# Patient Record
Sex: Female | Born: 1960 | Race: White | Hispanic: No | Marital: Single
Health system: Southern US, Community
[De-identification: ages and names within clinical notes are randomized; demographics above are authoritative.]

## PROBLEM LIST (undated history)

## (undated) DIAGNOSIS — E785 Hyperlipidemia, unspecified: Secondary | ICD-10-CM

## (undated) DIAGNOSIS — Q631 Lobulated, fused and horseshoe kidney: Secondary | ICD-10-CM

## (undated) DIAGNOSIS — I10 Essential (primary) hypertension: Secondary | ICD-10-CM

## (undated) DIAGNOSIS — E119 Type 2 diabetes mellitus without complications: Secondary | ICD-10-CM

## (undated) DIAGNOSIS — E781 Pure hyperglyceridemia: Secondary | ICD-10-CM

## (undated) HISTORY — PX: TOTAL KNEE ARTHROPLASTY: SHX125

---

## 2004-09-19 ENCOUNTER — Ambulatory Visit: Payer: Self-pay | Admitting: Obstetrics and Gynecology

## 2005-09-26 ENCOUNTER — Ambulatory Visit: Payer: Self-pay | Admitting: Obstetrics and Gynecology

## 2006-09-30 ENCOUNTER — Ambulatory Visit: Payer: Self-pay | Admitting: Obstetrics and Gynecology

## 2007-10-02 ENCOUNTER — Ambulatory Visit: Payer: Self-pay | Admitting: Obstetrics and Gynecology

## 2008-10-04 ENCOUNTER — Ambulatory Visit: Payer: Self-pay | Admitting: Obstetrics and Gynecology

## 2009-10-05 ENCOUNTER — Ambulatory Visit: Payer: Self-pay | Admitting: Obstetrics and Gynecology

## 2010-10-09 ENCOUNTER — Ambulatory Visit: Payer: Self-pay | Admitting: Obstetrics and Gynecology

## 2011-10-10 ENCOUNTER — Ambulatory Visit: Payer: Self-pay | Admitting: Obstetrics and Gynecology

## 2012-07-17 ENCOUNTER — Ambulatory Visit: Payer: Self-pay | Admitting: Neurology

## 2012-10-14 ENCOUNTER — Ambulatory Visit: Payer: Self-pay | Admitting: Obstetrics and Gynecology

## 2013-10-15 ENCOUNTER — Ambulatory Visit: Payer: Self-pay | Admitting: Obstetrics and Gynecology

## 2013-10-23 ENCOUNTER — Ambulatory Visit: Payer: Self-pay | Admitting: Obstetrics and Gynecology

## 2014-11-02 ENCOUNTER — Other Ambulatory Visit: Payer: Self-pay | Admitting: Obstetrics and Gynecology

## 2014-11-02 DIAGNOSIS — Z1231 Encounter for screening mammogram for malignant neoplasm of breast: Secondary | ICD-10-CM

## 2014-11-22 ENCOUNTER — Ambulatory Visit
Admission: RE | Admit: 2014-11-22 | Discharge: 2014-11-22 | Disposition: A | Discharge status: Home / Self Care | Payer: BLUE CROSS/BLUE SHIELD | Source: Ambulatory Visit | Attending: Obstetrics and Gynecology | Admitting: Obstetrics and Gynecology

## 2014-11-22 DIAGNOSIS — Z1231 Encounter for screening mammogram for malignant neoplasm of breast: Secondary | ICD-10-CM | POA: Diagnosis not present

## 2015-11-28 ENCOUNTER — Ambulatory Visit
Admission: RE | Admit: 2015-11-28 | Discharge: 2015-11-28 | Disposition: A | Discharge status: Home / Self Care | Payer: BLUE CROSS/BLUE SHIELD | Source: Ambulatory Visit | Attending: Obstetrics and Gynecology | Admitting: Obstetrics and Gynecology

## 2015-11-28 ENCOUNTER — Other Ambulatory Visit: Payer: Self-pay | Admitting: Obstetrics and Gynecology

## 2015-11-28 DIAGNOSIS — Z1231 Encounter for screening mammogram for malignant neoplasm of breast: Secondary | ICD-10-CM | POA: Insufficient documentation

## 2016-12-12 ENCOUNTER — Other Ambulatory Visit: Payer: Self-pay | Admitting: Obstetrics and Gynecology

## 2016-12-12 DIAGNOSIS — Z1231 Encounter for screening mammogram for malignant neoplasm of breast: Secondary | ICD-10-CM

## 2016-12-13 ENCOUNTER — Ambulatory Visit
Admission: RE | Admit: 2016-12-13 | Discharge: 2016-12-13 | Disposition: A | Discharge status: Home / Self Care | Payer: BLUE CROSS/BLUE SHIELD | Source: Ambulatory Visit | Attending: Obstetrics and Gynecology | Admitting: Obstetrics and Gynecology

## 2016-12-13 DIAGNOSIS — Z1231 Encounter for screening mammogram for malignant neoplasm of breast: Secondary | ICD-10-CM | POA: Insufficient documentation

## 2017-12-18 ENCOUNTER — Other Ambulatory Visit: Payer: Self-pay | Admitting: Obstetrics and Gynecology

## 2017-12-18 DIAGNOSIS — Z1231 Encounter for screening mammogram for malignant neoplasm of breast: Secondary | ICD-10-CM

## 2018-01-02 ENCOUNTER — Ambulatory Visit
Admission: RE | Admit: 2018-01-02 | Discharge: 2018-01-02 | Disposition: A | Discharge status: Home / Self Care | Payer: BLUE CROSS/BLUE SHIELD | Source: Ambulatory Visit | Attending: Obstetrics and Gynecology | Admitting: Obstetrics and Gynecology

## 2018-01-02 DIAGNOSIS — Z1231 Encounter for screening mammogram for malignant neoplasm of breast: Secondary | ICD-10-CM | POA: Diagnosis present

## 2018-12-25 ENCOUNTER — Other Ambulatory Visit: Payer: Self-pay | Admitting: Obstetrics and Gynecology

## 2018-12-25 DIAGNOSIS — Z1231 Encounter for screening mammogram for malignant neoplasm of breast: Secondary | ICD-10-CM

## 2019-02-04 ENCOUNTER — Ambulatory Visit
Admission: RE | Admit: 2019-02-04 | Discharge: 2019-02-04 | Disposition: A | Discharge status: Home / Self Care | Payer: No Typology Code available for payment source | Source: Ambulatory Visit | Attending: Obstetrics and Gynecology | Admitting: Obstetrics and Gynecology

## 2019-02-04 ENCOUNTER — Other Ambulatory Visit: Payer: Self-pay

## 2019-02-04 DIAGNOSIS — Z1231 Encounter for screening mammogram for malignant neoplasm of breast: Secondary | ICD-10-CM | POA: Insufficient documentation

## 2019-06-19 ENCOUNTER — Other Ambulatory Visit: Payer: Self-pay | Admitting: Nephrology

## 2019-06-19 ENCOUNTER — Telehealth: Payer: Self-pay | Admitting: *Deleted

## 2019-06-19 DIAGNOSIS — R809 Proteinuria, unspecified: Secondary | ICD-10-CM

## 2019-06-19 DIAGNOSIS — E1121 Type 2 diabetes mellitus with diabetic nephropathy: Secondary | ICD-10-CM

## 2019-06-22 ENCOUNTER — Telehealth: Payer: Self-pay | Admitting: *Deleted

## 2019-07-08 ENCOUNTER — Ambulatory Visit: Payer: PRIVATE HEALTH INSURANCE

## 2019-09-27 ENCOUNTER — Ambulatory Visit: Payer: PRIVATE HEALTH INSURANCE | Attending: Internal Medicine

## 2019-09-27 DIAGNOSIS — Z23 Encounter for immunization: Secondary | ICD-10-CM

## 2019-09-27 NOTE — Progress Notes (Signed)
   Covid-19 Vaccination Clinic  Name:  Alice Ross    MRN: [id number] DOB: 06-Jun-1961  09/27/2019  Ms. Alice Ross was observed post Covid-19 immunization for 15 minutes without incident. She was provided with Vaccine Information Sheet and instruction to access the V-Safe system.   Ms. Alice Ross was instructed to call 911 with any severe reactions post vaccine: Marland Kitchen Difficulty breathing  . Swelling of face and throat  . A fast heartbeat  . A bad rash all over body  . Dizziness and weakness   Immunizations Administered    Name Date Dose VIS Date Route   Pfizer COVID-19 Vaccine 09/27/2019  5:32 AM 0.3 mL 06/19/2019 Intramuscular   Manufacturer: [employer], [employer]   Lot: [id number]   NDC: [address]-6

## 2019-10-18 ENCOUNTER — Ambulatory Visit: Payer: PRIVATE HEALTH INSURANCE | Attending: Internal Medicine

## 2019-10-18 ENCOUNTER — Other Ambulatory Visit: Payer: Self-pay

## 2019-10-18 DIAGNOSIS — Z23 Encounter for immunization: Secondary | ICD-10-CM

## 2019-10-18 NOTE — Progress Notes (Signed)
   Covid-19 Vaccination Clinic  Name:  Alice Ross    MRN: [id number] DOB: 1961/03/09  10/18/2019  [name] was observed post Covid-19 immunization for 15 minutes without incident. She was provided with Vaccine Information Sheet and instruction to access the V-Safe system.   [name] was instructed to call 911 with any severe reactions post vaccine: Marland Kitchen Difficulty breathing  . Swelling of face and throat  . A fast heartbeat  . A bad rash all over body  . Dizziness and weakness   Immunizations Administered    Name Date Dose VIS Date Route   Pfizer COVID-19 Vaccine 10/18/2019  5:00 PM 0.3 mL 06/19/2019 Intramuscular   Manufacturer: [employer], [employer]   Lot: [id number]   NDC: [address]-4

## 2019-12-30 ENCOUNTER — Other Ambulatory Visit: Payer: Self-pay | Admitting: Obstetrics and Gynecology

## 2019-12-30 DIAGNOSIS — Z1231 Encounter for screening mammogram for malignant neoplasm of breast: Secondary | ICD-10-CM

## 2020-02-18 ENCOUNTER — Ambulatory Visit
Admission: RE | Admit: 2020-02-18 | Discharge: 2020-02-18 | Disposition: A | Discharge status: Home / Self Care | Payer: 59 | Source: Ambulatory Visit | Attending: Obstetrics and Gynecology | Admitting: Obstetrics and Gynecology

## 2020-02-18 ENCOUNTER — Other Ambulatory Visit: Payer: Self-pay

## 2020-02-18 DIAGNOSIS — Z1231 Encounter for screening mammogram for malignant neoplasm of breast: Secondary | ICD-10-CM

## 2020-07-24 IMAGING — MG DIGITAL SCREENING BILATERAL MAMMOGRAM WITH TOMO AND CAD
6 of 12 series · 6 of 36 positions shown · non-contrast
Comparison: Previous exam(s).

CLINICAL DATA: Screening.

EXAM:
DIGITAL SCREENING BILATERAL MAMMOGRAM WITH TOMO AND CAD

[L MLO synth-2D]
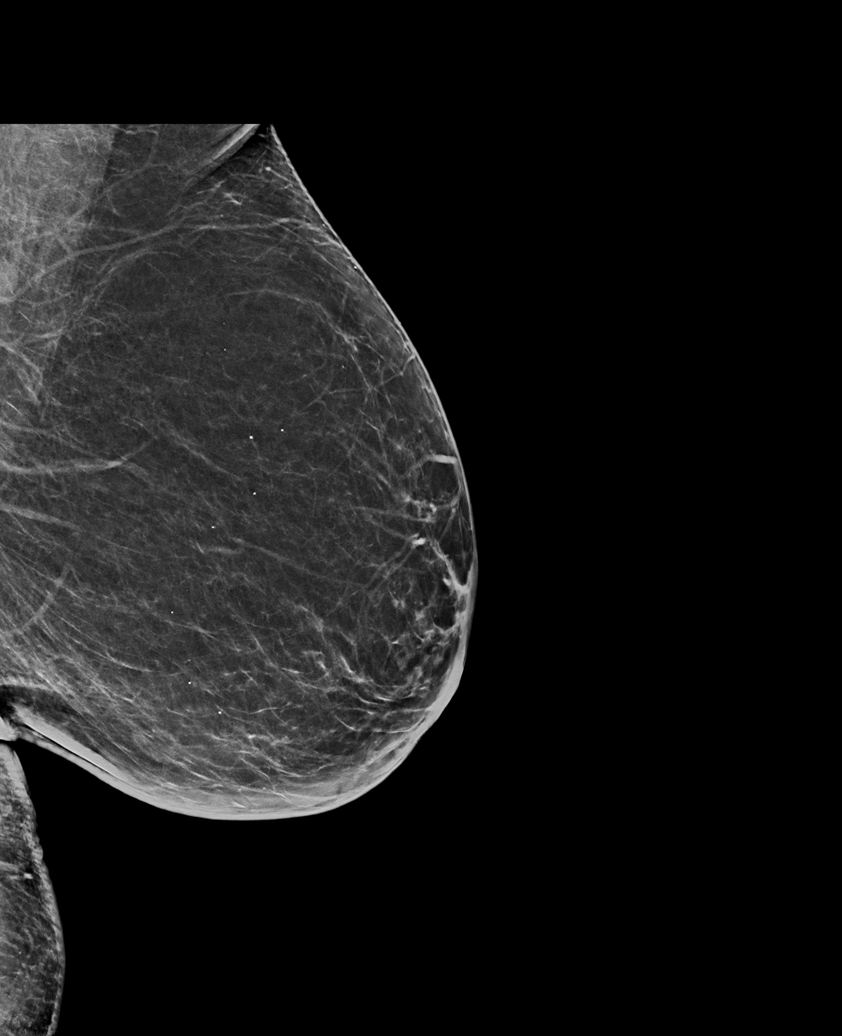

[R CC synth-2D (1 of 2)]
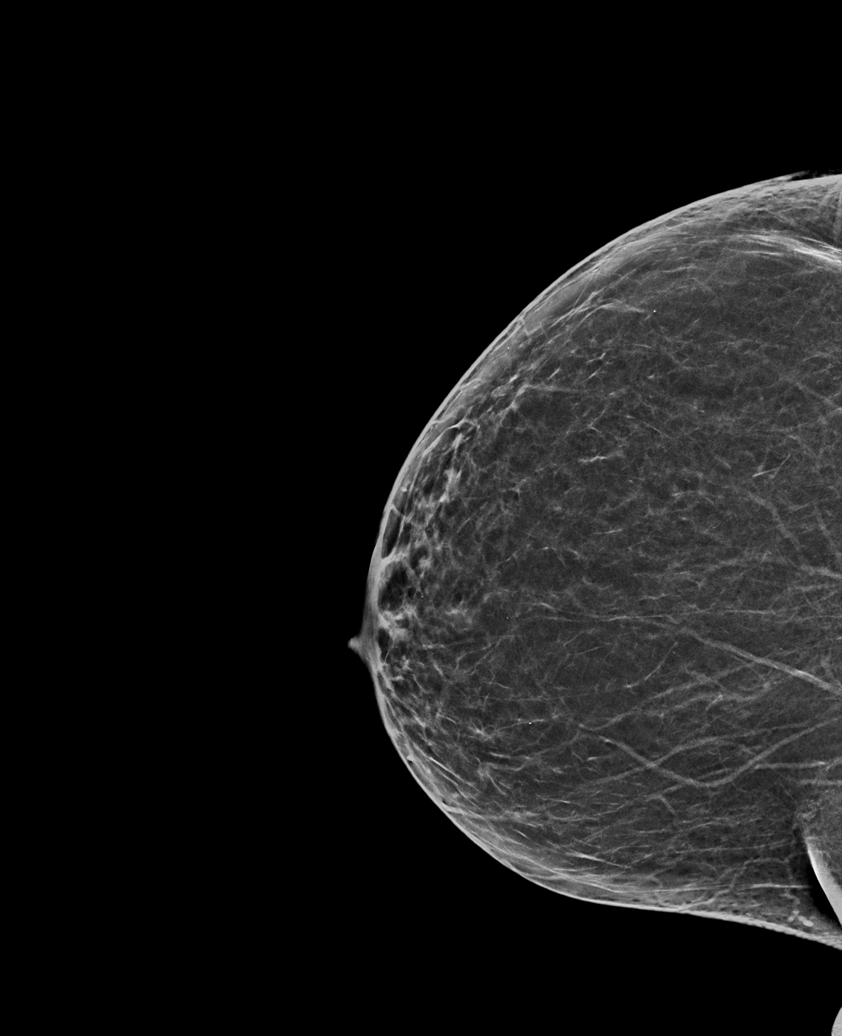

[L CC synth-2D]
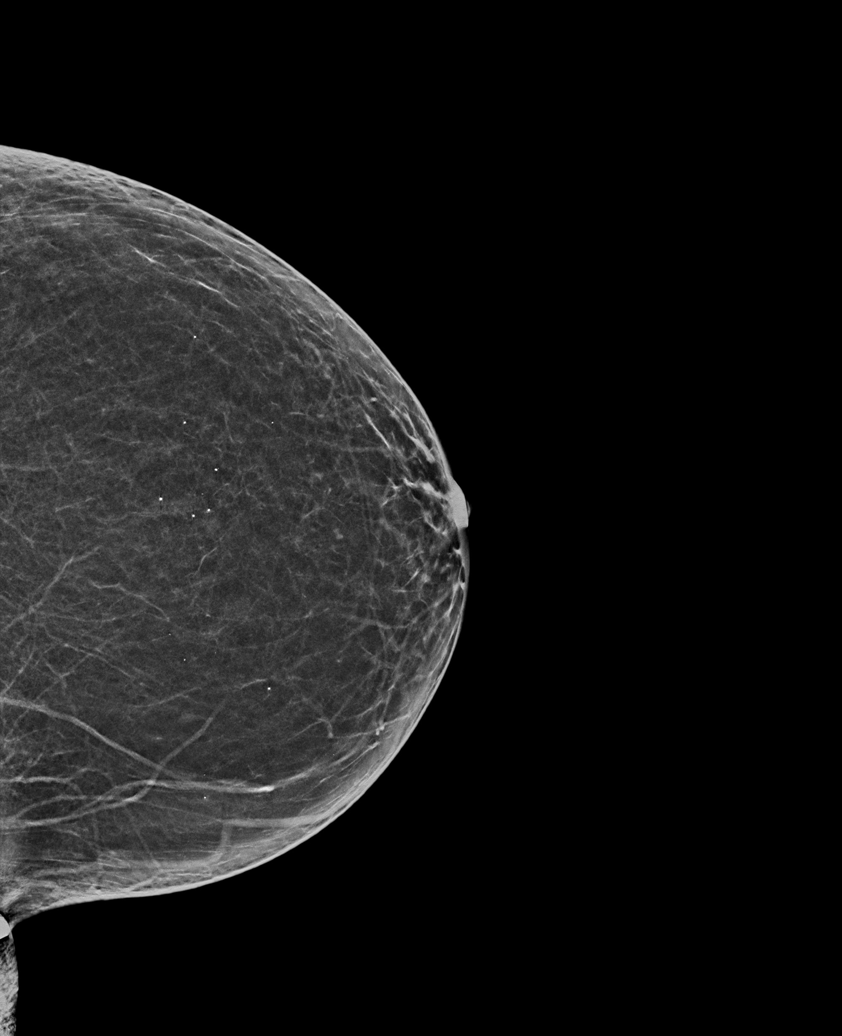

[R MLO synth-2D (1 of 2)]
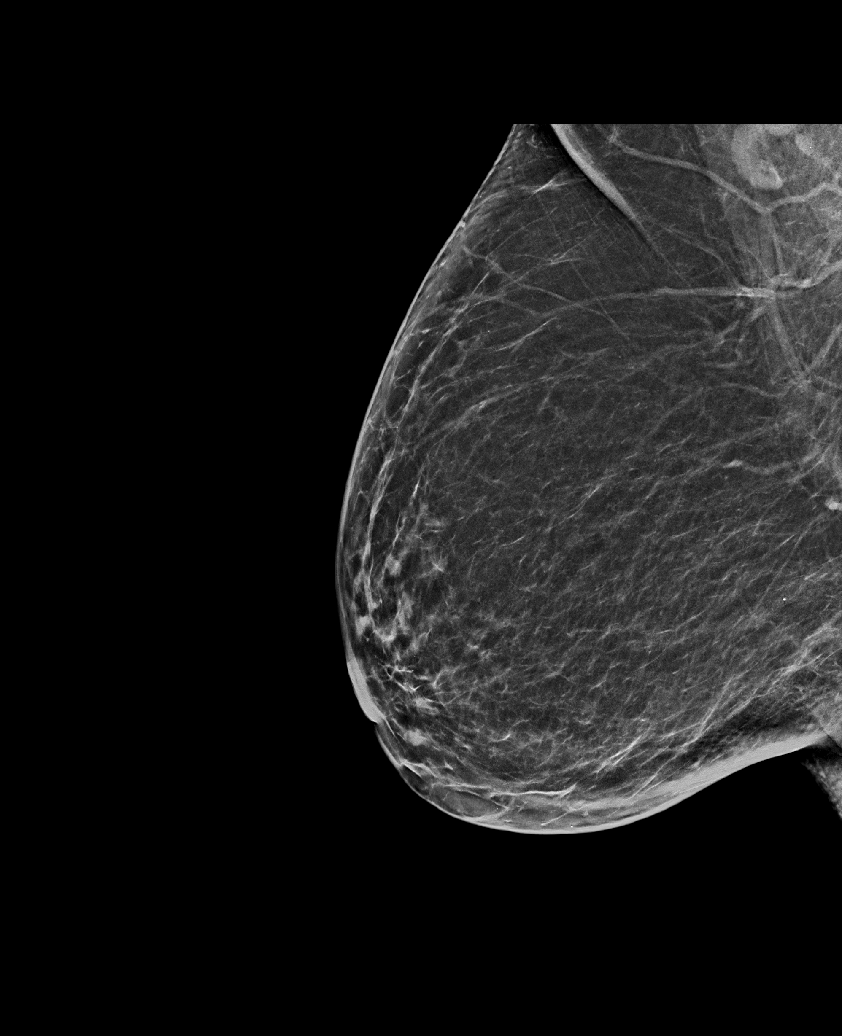

[R MLO synth-2D (2 of 2)]
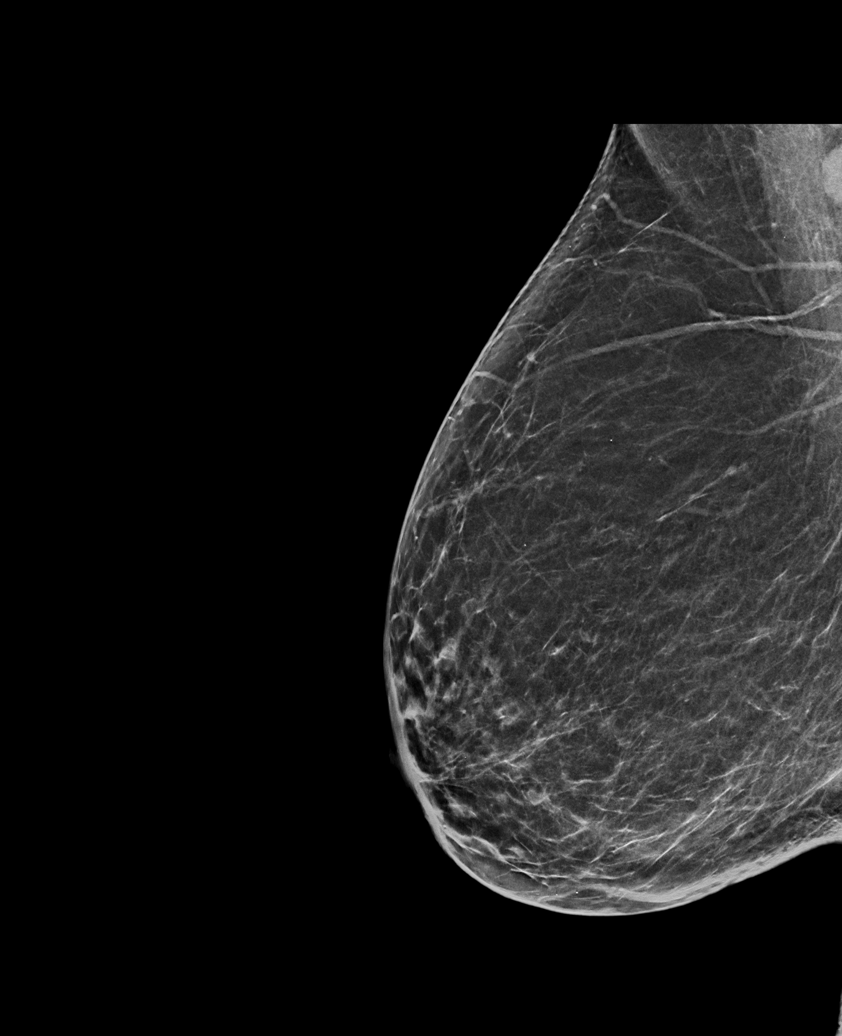

[R CC synth-2D (2 of 2)]
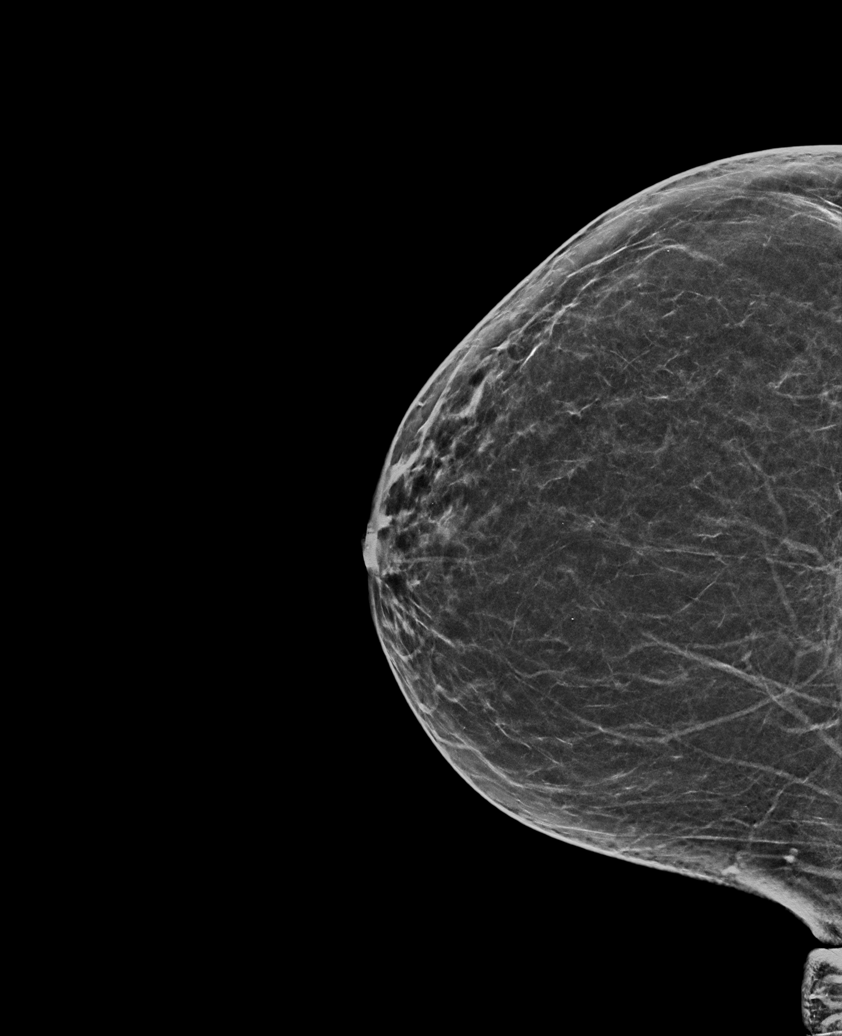

[6 of 36 positions shown; findings below may reference images not displayed]

ACR Breast Density Category b: There are scattered areas of
fibroglandular density.
FINDINGS: There are no findings suspicious for malignancy. Images were
processed with CAD.
IMPRESSION: No mammographic evidence of malignancy. A result letter of this
screening mammogram will be mailed directly to the patient.

RECOMMENDATION:
Screening mammogram in one year. (Code:[id number])

BI-RADS CATEGORY  1: Negative.

## 2021-01-05 ENCOUNTER — Other Ambulatory Visit: Payer: Self-pay | Admitting: Obstetrics and Gynecology

## 2021-01-05 DIAGNOSIS — Z1231 Encounter for screening mammogram for malignant neoplasm of breast: Secondary | ICD-10-CM

## 2021-02-20 ENCOUNTER — Other Ambulatory Visit: Payer: Self-pay

## 2021-02-20 ENCOUNTER — Ambulatory Visit
Admission: RE | Admit: 2021-02-20 | Discharge: 2021-02-20 | Disposition: A | Discharge status: Home / Self Care | Payer: 59 | Source: Ambulatory Visit | Attending: Obstetrics and Gynecology | Admitting: Obstetrics and Gynecology

## 2021-02-20 DIAGNOSIS — Z1231 Encounter for screening mammogram for malignant neoplasm of breast: Secondary | ICD-10-CM | POA: Insufficient documentation

## 2021-07-26 DIAGNOSIS — L308 Other specified dermatitis: Secondary | ICD-10-CM | POA: Diagnosis not present

## 2021-07-26 DIAGNOSIS — D225 Melanocytic nevi of trunk: Secondary | ICD-10-CM | POA: Diagnosis not present

## 2021-08-09 DIAGNOSIS — L309 Dermatitis, unspecified: Secondary | ICD-10-CM | POA: Diagnosis not present

## 2021-09-06 DIAGNOSIS — L309 Dermatitis, unspecified: Secondary | ICD-10-CM | POA: Diagnosis not present

## 2021-11-08 DIAGNOSIS — E1159 Type 2 diabetes mellitus with other circulatory complications: Secondary | ICD-10-CM | POA: Diagnosis not present

## 2021-11-08 DIAGNOSIS — I152 Hypertension secondary to endocrine disorders: Secondary | ICD-10-CM | POA: Diagnosis not present

## 2021-11-08 DIAGNOSIS — E1165 Type 2 diabetes mellitus with hyperglycemia: Secondary | ICD-10-CM | POA: Diagnosis not present

## 2021-11-08 DIAGNOSIS — E1169 Type 2 diabetes mellitus with other specified complication: Secondary | ICD-10-CM | POA: Diagnosis not present

## 2021-11-08 DIAGNOSIS — E785 Hyperlipidemia, unspecified: Secondary | ICD-10-CM | POA: Diagnosis not present

## 2022-01-23 ENCOUNTER — Other Ambulatory Visit: Payer: Self-pay | Admitting: Obstetrics and Gynecology

## 2022-01-23 DIAGNOSIS — Z1231 Encounter for screening mammogram for malignant neoplasm of breast: Secondary | ICD-10-CM | POA: Diagnosis not present

## 2022-01-23 DIAGNOSIS — Z01419 Encounter for gynecological examination (general) (routine) without abnormal findings: Secondary | ICD-10-CM | POA: Diagnosis not present

## 2022-01-23 DIAGNOSIS — Z1331 Encounter for screening for depression: Secondary | ICD-10-CM | POA: Diagnosis not present

## 2022-02-22 ENCOUNTER — Ambulatory Visit
Admission: RE | Admit: 2022-02-22 | Discharge: 2022-02-22 | Disposition: A | Discharge status: Home / Self Care | Payer: 59 | Source: Ambulatory Visit | Attending: Obstetrics and Gynecology | Admitting: Obstetrics and Gynecology

## 2022-02-22 DIAGNOSIS — Z1231 Encounter for screening mammogram for malignant neoplasm of breast: Secondary | ICD-10-CM | POA: Diagnosis not present

## 2022-04-17 DIAGNOSIS — E1165 Type 2 diabetes mellitus with hyperglycemia: Secondary | ICD-10-CM | POA: Diagnosis not present

## 2022-04-17 DIAGNOSIS — I152 Hypertension secondary to endocrine disorders: Secondary | ICD-10-CM | POA: Diagnosis not present

## 2022-04-17 DIAGNOSIS — E1159 Type 2 diabetes mellitus with other circulatory complications: Secondary | ICD-10-CM | POA: Diagnosis not present

## 2022-04-17 DIAGNOSIS — Z79899 Other long term (current) drug therapy: Secondary | ICD-10-CM | POA: Diagnosis not present

## 2022-04-17 DIAGNOSIS — E1169 Type 2 diabetes mellitus with other specified complication: Secondary | ICD-10-CM | POA: Diagnosis not present

## 2022-04-17 DIAGNOSIS — E785 Hyperlipidemia, unspecified: Secondary | ICD-10-CM | POA: Diagnosis not present

## 2022-04-24 DIAGNOSIS — E785 Hyperlipidemia, unspecified: Secondary | ICD-10-CM | POA: Diagnosis not present

## 2022-04-24 DIAGNOSIS — E1159 Type 2 diabetes mellitus with other circulatory complications: Secondary | ICD-10-CM | POA: Diagnosis not present

## 2022-04-24 DIAGNOSIS — E1165 Type 2 diabetes mellitus with hyperglycemia: Secondary | ICD-10-CM | POA: Diagnosis not present

## 2022-04-24 DIAGNOSIS — I152 Hypertension secondary to endocrine disorders: Secondary | ICD-10-CM | POA: Diagnosis not present

## 2022-04-24 DIAGNOSIS — E1169 Type 2 diabetes mellitus with other specified complication: Secondary | ICD-10-CM | POA: Diagnosis not present

## 2023-01-30 ENCOUNTER — Other Ambulatory Visit: Payer: Self-pay | Admitting: Obstetrics and Gynecology

## 2023-01-30 DIAGNOSIS — Z1231 Encounter for screening mammogram for malignant neoplasm of breast: Secondary | ICD-10-CM

## 2023-02-26 ENCOUNTER — Ambulatory Visit
Admission: RE | Admit: 2023-02-26 | Discharge: 2023-02-26 | Disposition: A | Discharge status: Home / Self Care | Payer: 59 | Source: Ambulatory Visit | Attending: Obstetrics and Gynecology | Admitting: Obstetrics and Gynecology

## 2023-02-26 DIAGNOSIS — Z1231 Encounter for screening mammogram for malignant neoplasm of breast: Secondary | ICD-10-CM | POA: Diagnosis not present

## 2023-05-09 ENCOUNTER — Encounter: Payer: Self-pay | Admitting: Internal Medicine

## 2023-05-22 ENCOUNTER — Ambulatory Visit: Payer: 59 | Admitting: Certified Registered"

## 2023-05-22 ENCOUNTER — Encounter: Payer: Self-pay | Admitting: Internal Medicine

## 2023-05-22 ENCOUNTER — Other Ambulatory Visit: Payer: Self-pay

## 2023-05-22 ENCOUNTER — Ambulatory Visit
Admission: RE | Admit: 2023-05-22 | Discharge: 2023-05-22 | Disposition: A | Discharge status: Home / Self Care | Payer: 59 | Attending: Internal Medicine | Admitting: Internal Medicine

## 2023-05-22 ENCOUNTER — Encounter
Admission: RE | Disposition: A | Discharge status: Home / Self Care | Payer: Self-pay | Source: Home / Self Care | Attending: Internal Medicine

## 2023-05-22 DIAGNOSIS — K640 First degree hemorrhoids: Secondary | ICD-10-CM | POA: Diagnosis not present

## 2023-05-22 DIAGNOSIS — I10 Essential (primary) hypertension: Secondary | ICD-10-CM | POA: Diagnosis not present

## 2023-05-22 DIAGNOSIS — K5730 Diverticulosis of large intestine without perforation or abscess without bleeding: Secondary | ICD-10-CM | POA: Insufficient documentation

## 2023-05-22 DIAGNOSIS — Z7984 Long term (current) use of oral hypoglycemic drugs: Secondary | ICD-10-CM | POA: Diagnosis not present

## 2023-05-22 DIAGNOSIS — Z1211 Encounter for screening for malignant neoplasm of colon: Secondary | ICD-10-CM | POA: Insufficient documentation

## 2023-05-22 DIAGNOSIS — E119 Type 2 diabetes mellitus without complications: Secondary | ICD-10-CM | POA: Diagnosis not present

## 2023-05-22 DIAGNOSIS — Z7985 Long-term (current) use of injectable non-insulin antidiabetic drugs: Secondary | ICD-10-CM | POA: Insufficient documentation

## 2023-05-22 DIAGNOSIS — Z83719 Family history of colon polyps, unspecified: Secondary | ICD-10-CM | POA: Diagnosis not present

## 2023-05-22 DIAGNOSIS — Z87891 Personal history of nicotine dependence: Secondary | ICD-10-CM | POA: Diagnosis not present

## 2023-05-22 HISTORY — DX: Essential (primary) hypertension: I10

## 2023-05-22 HISTORY — DX: Type 2 diabetes mellitus without complications: E11.9

## 2023-05-22 HISTORY — DX: Hyperlipidemia, unspecified: E78.5

## 2023-05-22 HISTORY — PX: COLONOSCOPY WITH PROPOFOL: SHX5780

## 2023-05-22 HISTORY — DX: Pure hyperglyceridemia: E78.1

## 2023-05-22 HISTORY — DX: Lobulated, fused and horseshoe kidney: Q63.1

## 2023-05-22 SURGERY — COLONOSCOPY WITH PROPOFOL
Anesthesia: General

## 2023-05-22 MED ORDER — SODIUM CHLORIDE 0.9 % IV SOLN: INTRAVENOUS | Status: DC

## 2023-05-22 MED ORDER — LIDOCAINE HCL (CARDIAC) PF 100 MG/5ML IV SOSY
PREFILLED_SYRINGE | INTRAVENOUS | Status: DC | PRN
  Administered 2023-05-22: 80 mg via INTRAVENOUS

## 2023-05-22 MED ORDER — PROPOFOL 10 MG/ML IV BOLUS
INTRAVENOUS | Status: DC | PRN
  Administered 2023-05-22: 80 mg via INTRAVENOUS

## 2023-05-22 MED ORDER — PROPOFOL 500 MG/50ML IV EMUL
INTRAVENOUS | Status: DC | PRN
  Administered 2023-05-22: 100 ug/kg/min via INTRAVENOUS

## 2023-05-22 NOTE — Transfer of Care (Signed)
Immediate Anesthesia Transfer of Care Note  Patient: Alice Ross  Procedure(s) Performed: COLONOSCOPY WITH PROPOFOL  Patient Location: PACU  Anesthesia Type:General  Level of Consciousness: awake, alert , and oriented  Airway & Oxygen Therapy: Patient Spontanous Breathing  Post-op Assessment: Report given to RN and Post -op Vital signs reviewed and stable  Post vital signs: Reviewed and stable  Last Vitals: Normal temp  see flow sheet  Vitals Value Taken Time  BP 98/61 05/22/23 0928  Temp    Pulse 76 05/22/23 0928  Resp 20 05/22/23 0928  SpO2 100 % 05/22/23 0928  Vitals shown include unfiled device data.  Last Pain:  Vitals:   05/22/23 0928  TempSrc:   PainSc: 0-No pain         Complications: No notable events documented.

## 2023-05-22 NOTE — Anesthesia Preprocedure Evaluation (Signed)
Anesthesia Evaluation  Patient identified by MRN, date of birth, ID band Patient awake    Reviewed: Allergy & Precautions, NPO status , Patient's Chart, lab work & pertinent test results  History of Anesthesia Complications Negative for: history of anesthetic complications  Airway Mallampati: III  TM Distance: <3 FB Neck ROM: full    Dental  (+) Chipped   Pulmonary neg pulmonary ROS, neg shortness of breath, former smoker   Pulmonary exam normal        Cardiovascular Exercise Tolerance: Good hypertension, (-) angina (-) Past MI Normal cardiovascular exam     Neuro/Psych negative neurological ROS  negative psych ROS   GI/Hepatic negative GI ROS, Neg liver ROS,neg GERD  ,,  Endo/Other  diabetes, Type 2    Renal/GU Renal disease  negative genitourinary   Musculoskeletal   Abdominal   Peds  Hematology negative hematology ROS (+)   Anesthesia Other Findings Past Medical History: No date: Diabetes mellitus without complication (HCC) 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 No date: Hyperlipidemia No date: Hypertension No date: Hypertriglyceridemia  Past Surgical History: No date: TOTAL KNEE ARTHROPLASTY; Bilateral  BMI    Body Mass Index: 25.65 kg/m      Reproductive/Obstetrics negative OB ROS                             Anesthesia Physical Anesthesia Plan  ASA: 3  Anesthesia Plan: General   Post-op Pain Management:    Induction: Intravenous  PONV Risk Score and Plan: Propofol infusion and TIVA  Airway Management Planned: Natural Airway and Nasal Cannula  Additional Equipment:   Intra-op Plan:   Post-operative Plan:   Informed Consent: I have reviewed the patients History and Physical, chart, labs and discussed the procedure including the risks, benefits and alternatives for the proposed anesthesia with the patient or authorized representative who has indicated his/her understanding  and acceptance.     Dental Advisory Given  Plan Discussed with: Anesthesiologist, CRNA and Surgeon  Anesthesia Plan Comments: (Patient consented for risks of anesthesia including but not limited to:  - adverse reactions to medications - risk of airway placement if required - damage to eyes, teeth, lips or other oral mucosa - nerve damage due to positioning  - sore throat or hoarseness - Damage to heart, brain, nerves, lungs, other parts of body or loss of life  Patient voiced understanding and assent.)       Anesthesia Quick Evaluation

## 2023-05-22 NOTE — H&P (Signed)
Outpatient short stay form Pre-procedure 05/22/2023 8:55 AM Alice Ross, M.D.  Primary Physician: Christeen Douglas, M.D.  Reason for visit:  Family history of colon polyps  History of present illness:   62 year old patient presenting for family history of colon cancer. Patient denies any change in bowel habits, rectal bleeding or involuntary weight loss. Last colonoscopy 08/05/17 ([facility]) was normal.    Current Facility-Administered Medications:    0.9 %  sodium chloride infusion, , Intravenous, Continuous, Vaughn Frieze, Boykin Nearing, MD  Medications Prior to Admission  Medication Sig Dispense Refill Last Dose   Alpha-Lipoic Acid 600 MG CAPS Take 600 mg by mouth in the morning and at bedtime.      atenolol-chlorthalidone (TENORETIC) 100-25 MG tablet Take 1 tablet by mouth daily.   05/21/2023   cholecalciferol (VITAMIN D3) 10 MCG (400 UNIT) TABS tablet Take 400 Units by mouth daily.   Past Week   co-enzyme Q-10 30 MG capsule Take 30 mg by mouth 3 (three) times daily.   Past Week   cyanocobalamin (VITAMIN B12) 500 MCG tablet Take 1,000 mcg by mouth daily.   Past Week   Dulaglutide (TRULICITY) 1.5 MG/0.5ML SOAJ Inject 1.5 mg into the skin once a week.   05/12/2023   glucosamine-chondroitin 500-400 MG tablet Take 1 tablet by mouth 3 (three) times daily.   Past Week   OMEGA-3 FATTY ACIDS PO Take 1 capsule by mouth daily.   Past Week   rosuvastatin (CRESTOR) 5 MG tablet Take 5 mg by mouth daily.   05/21/2023   metFORMIN (GLUCOPHAGE-XR) 500 MG 24 hr tablet Take 500 mg by mouth 2 (two) times daily with a meal. (Patient not taking: Reported on 05/22/2023)   Not Taking     No Known Allergies   Past Medical History:  Diagnosis Date   Diabetes mellitus without complication (HCC)    Horseshoe kidney    Hyperlipidemia    Hypertension    Hypertriglyceridemia     Review of systems:  Otherwise negative.    Physical Exam  Gen: Alert, oriented. Appears stated age.  HEENT: [place]/AT. PERRLA. Lungs:  CTA, no wheezes. CV: RR nl S1, S2. Abd: soft, benign, no masses. BS+ Ext: No edema. Pulses 2+    Planned procedures: Proceed with colonoscopy. The patient understands the nature of the planned procedure, indications, risks, alternatives and potential complications including but not limited to bleeding, infection, perforation, damage to internal organs and possible oversedation/side effects from anesthesia. The patient agrees and gives consent to proceed.  Please refer to procedure notes for findings, recommendations and patient disposition/instructions.     [name] K. [name], M.D. Gastroenterology 05/22/2023  8:55 AM

## 2023-05-22 NOTE — Anesthesia Postprocedure Evaluation (Signed)
Anesthesia Post Note  Patient: Alice Ross  Procedure(s) Performed: COLONOSCOPY WITH PROPOFOL  Patient location during evaluation: Endoscopy Anesthesia Type: General Level of consciousness: awake and alert Pain management: pain level controlled Vital Signs Assessment: post-procedure vital signs reviewed and stable Respiratory status: spontaneous breathing, nonlabored ventilation, respiratory function stable and patient connected to nasal cannula oxygen Cardiovascular status: blood pressure returned to baseline and stable Postop Assessment: no apparent nausea or vomiting Anesthetic complications: no   No notable events documented.   Last Vitals:  Vitals:   05/22/23 0928 05/22/23 0938  BP: 98/61 (!) 112/56  Pulse: 74 69  Resp: 15 16  Temp:    SpO2: 100% 100%    Last Pain:  Vitals:   05/22/23 0948  TempSrc:   PainSc: 0-No pain                 Cleda Mccreedy [place]

## 2023-05-22 NOTE — Interval H&P Note (Signed)
History and Physical Interval Note:  05/22/2023 8:56 AM  Alice Ross  has presented today for surgery, with the diagnosis of Z12.11 (ICD-10-CM) - Colon cancer screening Z83.719 (ICD-10-CM) - Family history of colonic polyps.  The various methods of treatment have been discussed with the patient and family. After consideration of risks, benefits and other options for treatment, the patient has consented to  Procedure(s): COLONOSCOPY WITH PROPOFOL (N/A) as a surgical intervention.  The patient's history has been reviewed, patient examined, no change in status, stable for surgery.  I have reviewed the patient's chart and labs.  Questions were answered to the patient's satisfaction.     [place], [place]

## 2023-05-22 NOTE — Op Note (Signed)
[facility] Gastroenterology Patient Name: Alice Ross Procedure Date: 05/22/2023 9:00 AM MRN: [id number] Account #: [id number] Date of Birth: 21-May-1961 Admit Type: Outpatient Age: 62 Room: [facility] ENDO ROOM 2 Gender: Female Note Status: Finalized Instrument Name: Prentice Docker [id number] Procedure:             Colonoscopy Indications:           Colon cancer screening in patient at increased risk:                         Family history of 1st-degree relative with colon polyps Providers:             Boykin Nearing. Norma Fredrickson MD, MD Referring MD:          Boykin Nearing. Norma Fredrickson MD, MD (Referring MD), Cline Cools (Referring MD) Medicines:             Propofol per Anesthesia Complications:         No immediate complications. Estimated blood loss: None. Procedure:             Pre-Anesthesia Assessment:                        - The risks and benefits of the procedure and the                         sedation options and risks were discussed with the                         patient. All questions were answered and informed                         consent was obtained.                        - Patient identification and proposed procedure were                         verified prior to the procedure by the nurse. The                         procedure was verified in the procedure room.                        - ASA Grade Assessment: II - A patient with mild                         systemic disease.                        - After reviewing the risks and benefits, the patient                         was deemed in satisfactory condition to undergo the                         procedure.  After obtaining informed consent, the colonoscope was                         passed under direct vision. Throughout the procedure,                         the patient's blood pressure, pulse, and oxygen                         saturations were monitored  continuously. The                         Colonoscope was introduced through the anus and                         advanced to the the cecum, identified by appendiceal                         orifice and ileocecal valve. The colonoscopy was                         performed without difficulty. The patient tolerated                         the procedure well. The quality of the bowel                         preparation was good. The ileocecal valve, appendiceal                         orifice, and rectum were photographed. Findings:      The perianal and digital rectal examinations were normal. Pertinent       negatives include normal sphincter tone and no palpable rectal lesions.      Non-bleeding internal hemorrhoids were found during retroflexion. The       hemorrhoids were Grade I (internal hemorrhoids that do not prolapse).      Many medium-mouthed diverticula were found in the entire colon. There       was no evidence of diverticular bleeding.      The exam was otherwise without abnormality. Impression:            - Non-bleeding internal hemorrhoids.                        - Mild diverticulosis in the entire examined colon.                         There was no evidence of diverticular bleeding.                        - The examination was otherwise normal.                        - No specimens collected. Recommendation:        - Patient has a contact number available for                         emergencies. The signs and symptoms of potential  delayed complications were discussed with the patient.                         Return to normal activities tomorrow. Written                         discharge instructions were provided to the patient.                        - Resume previous diet.                        - Continue present medications.                        - Repeat colonoscopy in 5 years for screening purposes.                        - Return to GI  office PRN.                        - The findings and recommendations were discussed with                         the patient. Procedure Code(s):     --- Professional ---                        W0981, Colorectal cancer screening; colonoscopy on                         individual at high risk Diagnosis Code(s):     --- Professional ---                        K57.30, Diverticulosis of large intestine without                         perforation or abscess without bleeding                        K64.0, First degree hemorrhoids                        Z83.71, Family history of colonic polyps CPT copyright 2022 [employer]. All rights reserved. The codes documented in this report are preliminary and upon coder review may  be revised to meet current compliance requirements. Stanton Kidney MD, MD 05/22/2023 9:27:28 AM This report has been signed electronically. Number of Addenda: 0 Note Initiated On: 05/22/2023 9:00 AM Scope Withdrawal Time: 0 hours 5 minutes 58 seconds  Total Procedure Duration: 0 hours 9 minutes 15 seconds  Estimated Blood Loss:  Estimated blood loss: none. Estimated blood loss: none.      [facility]

## 2023-05-23 ENCOUNTER — Encounter: Payer: Self-pay | Admitting: Internal Medicine

## 2024-02-26 ENCOUNTER — Encounter: Payer: Self-pay | Admitting: Family Medicine

## 2024-02-26 ENCOUNTER — Other Ambulatory Visit: Payer: Self-pay | Admitting: Obstetrics and Gynecology

## 2024-02-26 DIAGNOSIS — Z1231 Encounter for screening mammogram for malignant neoplasm of breast: Secondary | ICD-10-CM

## 2024-03-24 ENCOUNTER — Ambulatory Visit
Admission: RE | Admit: 2024-03-24 | Discharge: 2024-03-24 | Disposition: A | Discharge status: Home / Self Care | Source: Ambulatory Visit | Attending: Obstetrics and Gynecology | Admitting: Obstetrics and Gynecology

## 2024-03-24 DIAGNOSIS — Z1231 Encounter for screening mammogram for malignant neoplasm of breast: Secondary | ICD-10-CM | POA: Diagnosis present
